# Patient Record
Sex: Male | Born: 1935 | Race: White | Hispanic: No | Marital: Married
Health system: Southern US, Community
[De-identification: ages and names within clinical notes are randomized; demographics above are authoritative.]

---

## 2006-03-19 ENCOUNTER — Other Ambulatory Visit: Payer: Self-pay

## 2006-03-19 ENCOUNTER — Inpatient Hospital Stay: Payer: Self-pay | Admitting: Internal Medicine

## 2006-04-22 ENCOUNTER — Ambulatory Visit: Payer: Self-pay | Admitting: Vascular Surgery

## 2006-06-09 ENCOUNTER — Ambulatory Visit: Payer: Self-pay | Admitting: Vascular Surgery

## 2006-07-24 ENCOUNTER — Ambulatory Visit: Payer: Self-pay | Admitting: Vascular Surgery

## 2006-07-29 ENCOUNTER — Ambulatory Visit: Payer: Self-pay | Admitting: Vascular Surgery

## 2006-07-31 ENCOUNTER — Ambulatory Visit: Payer: Self-pay | Admitting: Vascular Surgery

## 2006-08-19 ENCOUNTER — Inpatient Hospital Stay: Payer: Self-pay | Admitting: Vascular Surgery

## 2006-09-15 ENCOUNTER — Ambulatory Visit: Payer: Self-pay | Admitting: Vascular Surgery

## 2006-12-18 ENCOUNTER — Ambulatory Visit: Payer: Self-pay | Admitting: Vascular Surgery

## 2007-08-10 ENCOUNTER — Ambulatory Visit: Payer: Self-pay | Admitting: Vascular Surgery

## 2008-07-20 ENCOUNTER — Ambulatory Visit: Payer: Self-pay | Admitting: Vascular Surgery

## 2009-01-17 ENCOUNTER — Ambulatory Visit: Payer: Self-pay | Admitting: Vascular Surgery

## 2009-04-15 ENCOUNTER — Emergency Department: Payer: Self-pay | Admitting: Emergency Medicine

## 2010-12-23 ENCOUNTER — Ambulatory Visit: Payer: Self-pay | Admitting: Vascular Surgery

## 2012-01-02 ENCOUNTER — Ambulatory Visit: Payer: Self-pay | Admitting: Vascular Surgery

## 2013-01-26 ENCOUNTER — Ambulatory Visit: Payer: Self-pay | Admitting: Ophthalmology

## 2013-02-08 ENCOUNTER — Ambulatory Visit: Payer: Self-pay | Admitting: Ophthalmology

## 2013-09-14 ENCOUNTER — Ambulatory Visit: Payer: Self-pay | Admitting: Specialist

## 2014-01-16 ENCOUNTER — Inpatient Hospital Stay: Payer: Self-pay | Admitting: Surgery

## 2014-01-16 LAB — LIPASE, BLOOD: LIPASE: 182 U/L (ref 73–393)

## 2014-01-16 LAB — COMPREHENSIVE METABOLIC PANEL
ALBUMIN: 3.4 g/dL (ref 3.4–5.0)
ALK PHOS: 59 U/L
ALT: 10 U/L — AB (ref 12–78)
ANION GAP: 10 (ref 7–16)
AST: 23 U/L (ref 15–37)
BUN: 22 mg/dL — AB (ref 7–18)
Bilirubin,Total: 0.4 mg/dL (ref 0.2–1.0)
CALCIUM: 9.6 mg/dL (ref 8.5–10.1)
Chloride: 97 mmol/L — ABNORMAL LOW (ref 98–107)
Co2: 31 mmol/L (ref 21–32)
Creatinine: 1.99 mg/dL — ABNORMAL HIGH (ref 0.60–1.30)
EGFR (African American): 37 — ABNORMAL LOW
EGFR (Non-African Amer.): 32 — ABNORMAL LOW
GLUCOSE: 97 mg/dL (ref 65–99)
Osmolality: 279 (ref 275–301)
POTASSIUM: 3.1 mmol/L — AB (ref 3.5–5.1)
SODIUM: 138 mmol/L (ref 136–145)
Total Protein: 6.8 g/dL (ref 6.4–8.2)

## 2014-01-16 LAB — CBC
HCT: 27 % — ABNORMAL LOW (ref 40.0–52.0)
HGB: 8.4 g/dL — ABNORMAL LOW (ref 13.0–18.0)
MCH: 23.3 pg — ABNORMAL LOW (ref 26.0–34.0)
MCHC: 31.1 g/dL — ABNORMAL LOW (ref 32.0–36.0)
MCV: 75 fL — ABNORMAL LOW (ref 80–100)
Platelet: 235 10*3/uL (ref 150–440)
RBC: 3.61 10*6/uL — AB (ref 4.40–5.90)
RDW: 20.2 % — AB (ref 11.5–14.5)
WBC: 6 10*3/uL (ref 3.8–10.6)

## 2014-01-16 LAB — BASIC METABOLIC PANEL
ANION GAP: 5 — AB (ref 7–16)
BUN: 31 mg/dL — ABNORMAL HIGH (ref 7–18)
CHLORIDE: 107 mmol/L (ref 98–107)
CO2: 29 mmol/L (ref 21–32)
Calcium, Total: 8.2 mg/dL — ABNORMAL LOW (ref 8.5–10.1)
Creatinine: 1.71 mg/dL — ABNORMAL HIGH (ref 0.60–1.30)
EGFR (African American): 44 — ABNORMAL LOW
EGFR (Non-African Amer.): 38 — ABNORMAL LOW
GLUCOSE: 115 mg/dL — AB (ref 65–99)
OSMOLALITY: 289 (ref 275–301)
Potassium: 4.2 mmol/L (ref 3.5–5.1)
Sodium: 141 mmol/L (ref 136–145)

## 2014-01-16 LAB — TROPONIN I: TROPONIN-I: 0.04 ng/mL

## 2014-01-16 LAB — PROTIME-INR
INR: 1.1
PROTHROMBIN TIME: 14 s (ref 11.5–14.7)

## 2014-01-16 LAB — ETHANOL: Ethanol: <3 mg/dL

## 2014-01-16 LAB — APTT: ACTIVATED PTT: 23.9 s (ref 23.6–35.9)

## 2014-01-16 LAB — MAGNESIUM: Magnesium: 1.8 mg/dL

## 2014-01-17 ENCOUNTER — Ambulatory Visit: Payer: Self-pay | Admitting: Internal Medicine

## 2014-01-17 LAB — CBC WITH DIFFERENTIAL/PLATELET
Basophil #: 0 10*3/uL (ref 0.0–0.1)
Basophil %: 0.1 %
EOS ABS: 0 10*3/uL (ref 0.0–0.7)
Eosinophil %: 0 %
HCT: 20.9 % — AB (ref 40.0–52.0)
HGB: 6.5 g/dL — AB (ref 13.0–18.0)
Lymphocyte #: 0.2 10*3/uL — ABNORMAL LOW (ref 1.0–3.6)
Lymphocyte %: 3 %
MCH: 23.4 pg — ABNORMAL LOW (ref 26.0–34.0)
MCHC: 31 g/dL — AB (ref 32.0–36.0)
MCV: 76 fL — AB (ref 80–100)
MONO ABS: 0.2 x10 3/mm (ref 0.2–1.0)
Monocyte %: 3.2 %
Neutrophil #: 7.1 10*3/uL — ABNORMAL HIGH (ref 1.4–6.5)
Neutrophil %: 93.7 %
Platelet: 174 10*3/uL (ref 150–440)
RBC: 2.77 10*6/uL — ABNORMAL LOW (ref 4.40–5.90)
RDW: 20.1 % — AB (ref 11.5–14.5)
WBC: 7.5 10*3/uL (ref 3.8–10.6)

## 2014-01-17 LAB — BASIC METABOLIC PANEL
ANION GAP: 6 — AB (ref 7–16)
BUN: 30 mg/dL — AB (ref 7–18)
Calcium, Total: 7.6 mg/dL — ABNORMAL LOW (ref 8.5–10.1)
Chloride: 112 mmol/L — ABNORMAL HIGH (ref 98–107)
Co2: 25 mmol/L (ref 21–32)
Creatinine: 1.39 mg/dL — ABNORMAL HIGH (ref 0.60–1.30)
EGFR (African American): 57 — ABNORMAL LOW
EGFR (Non-African Amer.): 49 — ABNORMAL LOW
GLUCOSE: 141 mg/dL — AB (ref 65–99)
Osmolality: 294 (ref 275–301)
POTASSIUM: 4.4 mmol/L (ref 3.5–5.1)
Sodium: 143 mmol/L (ref 136–145)

## 2014-01-21 LAB — CULTURE, BLOOD (SINGLE)

## 2014-01-22 ENCOUNTER — Ambulatory Visit: Payer: Self-pay | Admitting: Internal Medicine

## 2014-01-22 DEATH — deceased

## 2014-09-12 IMAGING — CT CT ANGIO CHEST-ABD
2 of 6 series · 11 of 46 positions shown, 12 images · IV contrast (APPLIED)
Comparison: CTA of the chest and abdomen performed 01/02/2012

CLINICAL DATA: Hypotension. Chest pain radiating to the back.
Nausea and vomiting. Abdominal pain.

EXAM:
CT ANGIOGRAPHY CHEST, ABDOMEN AND PELVIS
TECHNIQUE: Multidetector CT imaging through the chest, abdomen and pelvis was
performed using the standard protocol during bolus administration of
intravenous contrast. Multiplanar reconstructed images and MIPs were
obtained and reviewed to evaluate the vascular anatomy.
CONTRAST:  100 mL of Isovue 370 IV contrast

[Series 6: cta chest · axial · 0.77mm/px · z∈[-635,-89]mm · 8 of 323 slices shown, 9 images]
[im 25/323  soft-tissue]
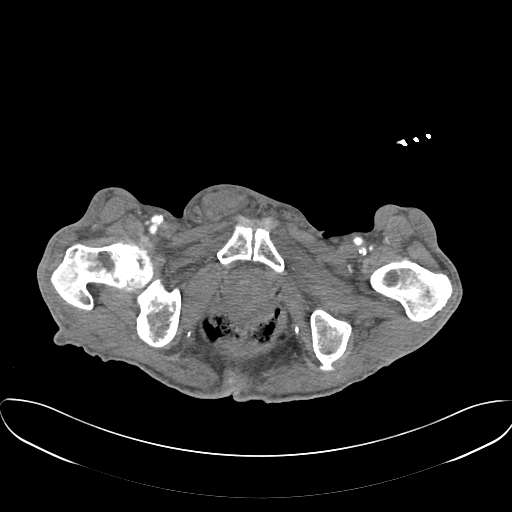
[im 25/323  bone]
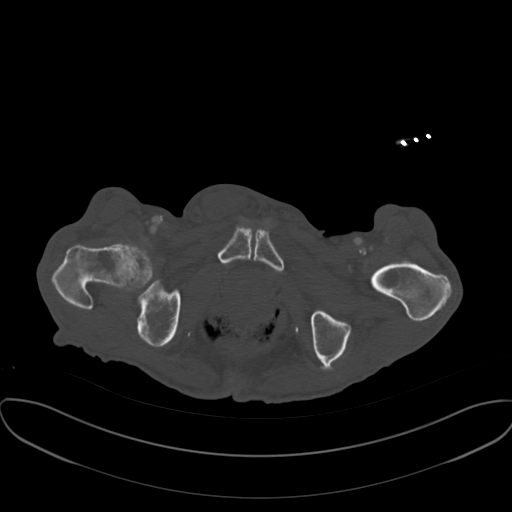
[im 62/323  soft-tissue]
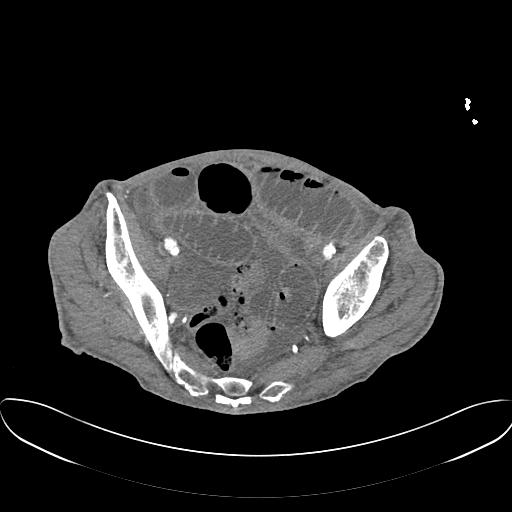
[im 100/323  soft-tissue]
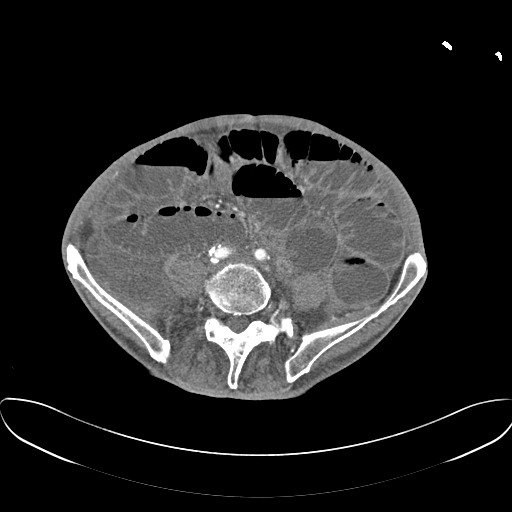
[im 137/323  soft-tissue]
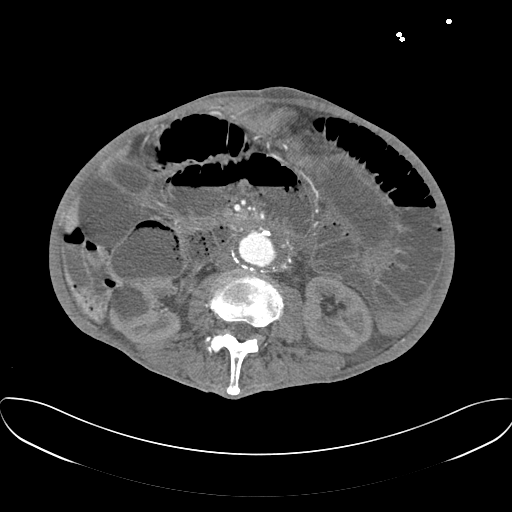
[im 186/323  soft-tissue]
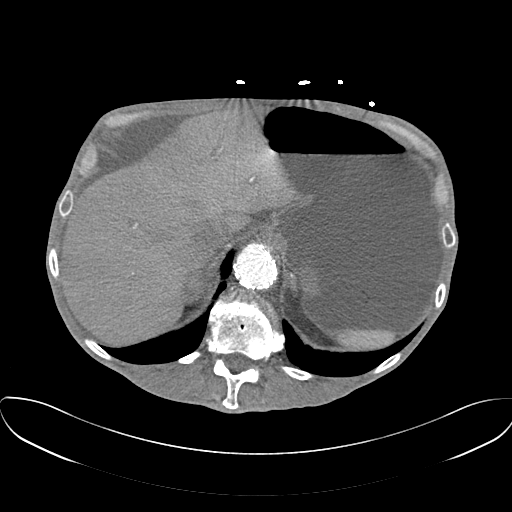
[im 223/323  soft-tissue]
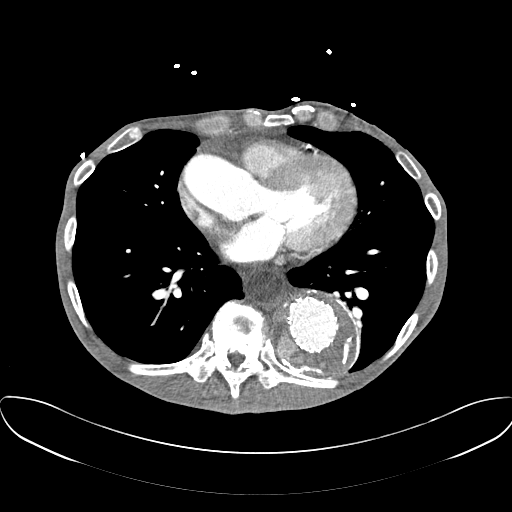
[im 261/323  soft-tissue]
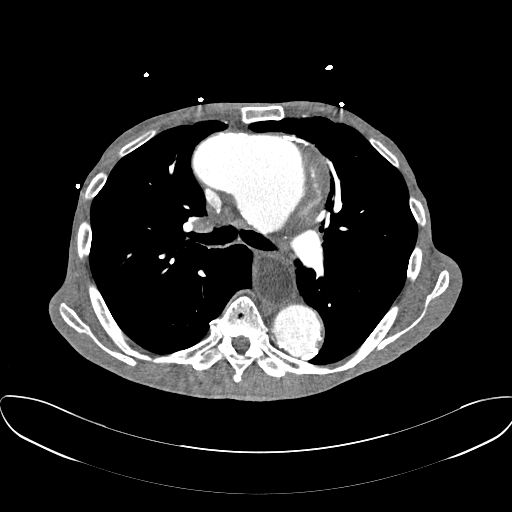
[im 298/323  soft-tissue]
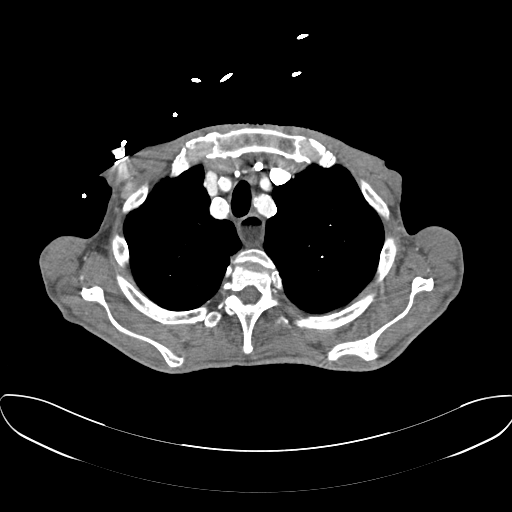

[Series 7: cor cta chest mpr · coronal · 1.23mm/px · 3 of 129 slices shown]
[im 33/129  soft-tissue]
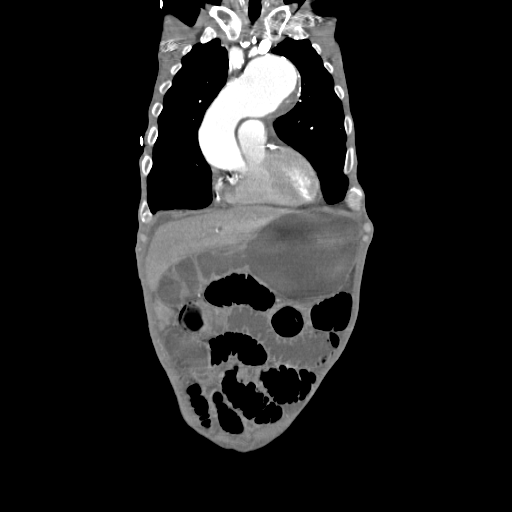
[im 65/129  soft-tissue]
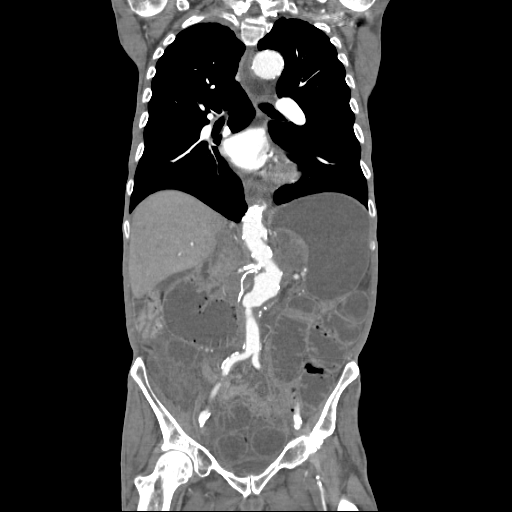
[im 97/129  soft-tissue]
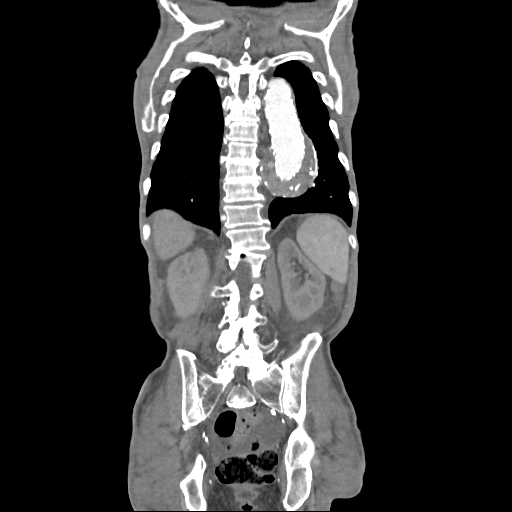

[11 of 46 positions shown; findings below may reference images not displayed]

FINDINGS: CTA CHEST FINDINGS

There is diffuse dilatation of the thoracic aorta, as on the prior
study. Dilatation of the ascending thoracic aorta is relatively
stable, measuring 4.6 cm in AP dimension. There is significantly
worsened massive dilatation of the distal aortic arch, measuring
approximately 8.5 cm in maximal diameter. This is worsened from
cm on the prior study. Mildly increased associated mural thrombus is
seen, with trace contrast extending into the thrombus. There is no
definite evidence of hemorrhage from the aneurysm; new trace
adjacent pericardial fluid remains borderline normal in attenuation,
without evidence of contrast extravasation.

Dilatation of the distal descending thoracic aorta is again seen,
now measuring 6.7 cm, increased from 6.4 cm on the prior study. This
reflects gradual chronic endoleak into the aneurysm sac surrounding
the patient's aortic stent graft; the amount of endoleak is
increased from the prior study. Aneurysmal dilatation of the
thoracic aorta resolves just above the level of the diaphragm.

Scattered calcific atherosclerotic disease is noted along the
thoracic aorta and proximal great vessels, with perhaps mild luminal
narrowing involving the left subclavian artery. Diffuse coronary
artery calcifications are seen.

There is a poorly characterized spiculated mass at the right lung
apex, measuring approximately 4.5 x 4.1 cm, with diffuse erosion of
the right first rib. Though malignancy is a possibility, this has
grown very slowly since 0604, and may reflect an unusual chronic
infectious process, such as Nocardia or Actinomyces.

Minimal airspace opacity within the left upper lobe may reflect a
mild infectious process; two nodules within the left upper lobe,
measuring 8 mm (image 30 of 65) and 6 mm (image [DATE]
reflect the infectious process or possibly metastatic disease. Mild
emphysematous change is noted at the upper lung lobes, with a few
associated blebs. Mild scarring is noted at the left lung base. No
significant pleural effusion or pneumothorax is seen.

There is diffuse dilatation of the esophagus, which is filled with
fluid. As described above, a trace pericardial effusion is seen. No
definite mediastinal lymphadenopathy seen. The thyroid gland is
grossly unremarkable in appearance. No axillary lymphadenopathy is
appreciated.

No acute osseous abnormalities are identified.

Review of the MIP images confirms the above findings.

CTA ABDOMEN AND PELVIS FINDINGS

As before, there is aneurysmal dilatation of the abdominal aorta,
extending from just below the level of the diaphragm to the
intrarenal abdominal aorta. It measures 4.2 cm in maximal AP
dimension and 4.7 cm in maximal transverse dimension, with
associated mural thrombus. There is mild to moderate luminal
narrowing at the distal aspect of the aneurysm, worsened from the
prior study. Diffuse calcification is seen along the renal arteries
bilaterally.

An apparent peripherally calcified thrombosed 3.5 cm aneurysm is
noted along the course of the proximal celiac trunk; this has
slightly increased in size from the prior study. Diffuse
calcification is seen along the superior mesenteric artery. The
inferior mesenteric artery is not well assessed.

The IVC is not well characterized but appears grossly unremarkable.
The portal venous system is incompletely assessed but also appears
unremarkable.

There is diffuse dilatation of much of the small bowel throughout
the abdomen and pelvis, measuring up to 4.6 cm in diameter. Small
bowel dilatation extends to the level of a focal transition point at
the right mid abdomen, though more distal small bowel loops are not
well assessed due to surrounding small volume ascites within the
abdomen and pelvis. Underlying mesenteric edema is suspected, but
not well seen, given the lack of intraperitoneal fat.

There is associated distention of the stomach with fluid and air,
and distention of the esophagus, as described above. Findings are
compatible with high-grade small bowel obstruction. The obstruction
may reflect an underlying adhesion.

There is an unusual area of heterogeneous fluid attenuation inferior
to the level of the cecum, measuring 8.7 x 8.7 x 4.1 cm; though this
may simply reflect partially decompressed small bowel, an impacted
mucocele or phlegmon could have such an appearance. The colon is
largely decompressed. Scattered diverticulosis is noted along the
sigmoid colon, without evidence of diverticulitis.

The liver and spleen are unremarkable in appearance. The gallbladder
is within normal limits. The pancreas and adrenal glands are
unremarkable.

Mild bilateral renal atrophy is noted. An 3.1 cm cyst is noted at
the interpole region of the right kidney, with additional smaller
cysts seen bilaterally. The kidneys are otherwise unremarkable in
appearance. There is no evidence of hydronephrosis. No renal or
ureteral stones are seen.

No free fluid is identified. The small bowel is unremarkable in
appearance. The stomach is within normal limits. No acute vascular
abnormalities are seen.

The appendix is not definitely characterized.

The bladder is mildly distended and grossly unremarkable. The
prostate remains normal in size. Note is made of the right testis
along the right inguinal canal; this may be transient in nature. No
inguinal lymphadenopathy is seen.

No acute osseous abnormalities are identified. There is chronic
grade 2 anterolisthesis of L5 on S1, reflecting underlying facet
disease. Mild anterior wedging is noted at T11 and L2, chronic in
nature.

Review of the MIP images confirms the above findings.
IMPRESSION: 1. Diffuse dilatation of much of the small bowel throughout the
abdomen and pelvis, measuring up to 4.6 cm in diameter. Small bowel
dilatation extends to the level of a focal transition point at the
right mid abdomen, though the more distal small bowel loops are not
well assessed due to surrounding small volume ascites within the
abdomen and pelvis. Underlying mesenteric edema suspected, but not
well characterized due to the lack of intraperitoneal fat. Findings
compatible with high-grade small bowel obstruction, possibly due to
an underlying adhesion.
2. Associated distention of the stomach with fluid and air, and
diffuse distention of the esophagus with fluid.
3. Unusual area of heterogeneous fluid attenuation noted inferior to
the level of the cecum, measuring 8.7 x 8.7 x 4.1 cm; though this
may simply reflect partially decompressed small bowel, an impacted
mucocele or phlegmon could have such an appearance.
4. Enlarging spiculated mass noted at the right lung apex, measuring
approximately 4.5 x 4.1 cm, with diffuse erosion of the right first
rib. Though malignancy is a possibility, this has grown very slowly
since 0604, at which time it measured 2.8 cm, and it may reflect an
unusual chronic infectious process, such as Nocardia or Actinomyces.
5. Minimal airspace opacity within the left upper lung lobe may
reflect a mild infectious process; two pulmonary nodules, measuring
8 mm and 6 mm, within the left upper lobe, may reflect the
infectious process or possibly metastatic disease.
6. Worsening massive aneurysmal dilatation of the distal aortic
arch, measuring 8.5 cm in maximal diameter, worsened from 6.8 cm on
the prior study. Mildly increased associated mural thrombus seen,
with trace contrast extending into the thrombus. Dilatation of the
distal descending thoracic aorta again noted, now measuring 6.7 cm,
increased from 6.4 cm on the prior study, with increasing associated
mural thrombus. This reflects gradually increased chronic endoleak
from the underlying aortic stent graft.
7. Aneurysmal dilatation of the abdominal aorta again noted,
measuring 4.2 cm in AP dimension and 4.7 cm in transverse dimension,
with associated mural thrombus. Mild to moderate luminal narrowing
at the distal aspect of the aneurysm is worsened from the prior
study.
8. Peripherally calcified thrombosed 3.5 cm aneurysm along the
course of the proximal celiac trunk; this has increased mildly in
size from the prior study.
9. Diffuse coronary artery calcifications seen. Diffuse
calcification along the renal arteries bilaterally, and along the
superior mesenteric artery.
10. Mild emphysematous change at the upper lung lobes, with a few
associated blebs; mild scarring at the left lung base.
11. New trace pericardial effusion; this remains borderline normal
in attenuation, without evidence of hemorrhage into the mediastinum.
12. Mild bilateral renal atrophy; small bilateral renal cysts seen.
13. Right testis incidentally noted along the right inguinal canal;
this may reflect transient retraction of the right testis.
14. Chronic grade 2 retrolisthesis of L5 on S1, reflecting
underlying facet disease; mild chronic anterior wedging of T11 and
L2.

These results were called by telephone at the time of interpretation
on 01/16/2014 at [DATE] to Dr. OGUM BULALA, who verbally acknowledged
these results.

## 2015-03-16 NOTE — Op Note (Signed)
PATIENT NAME:  Timothy Barron, [name] MR#:  [id number] DATE OF BIRTH:  12/07/1934  DATE OF PROCEDURE:  02/08/2013  PREOPERATIVE DIAGNOSIS: Visually significant cataract of the right eye.   POSTOPERATIVE DIAGNOSIS: Visually significant cataract of the right eye.   OPERATIVE PROCEDURE: Cataract extraction by phacoemulsification with implant of intraocular lens to right eye.   SURGEON: Galen ManilaWilliam Simcha Farrington, MD.   ANESTHESIA:  1. Managed anesthesia care.  2. Topical tetracaine drops followed by 2% Xylocaine jelly applied in the preoperative holding area.   COMPLICATIONS: None.   TECHNIQUE:  Stop and chop.  DESCRIPTION OF PROCEDURE: The patient was examined and consented in the preoperative holding area where the aforementioned topical anesthesia was applied to the right eye and then brought back to the Operating Room where the right eye was prepped and draped in the usual sterile ophthalmic fashion and a lid speculum was placed. A paracentesis was created with the side port blade and the anterior chamber was filled with viscoelastic. A near clear corneal incision was performed with the steel keratome. A continuous curvilinear capsulorrhexis was performed with a cystotome followed by the capsulorrhexis forceps. Hydrodissection and hydrodelineation were carried out with BSS on a blunt cannula. The lens was removed in a stop and chop technique and the remaining cortical material was removed with the irrigation-aspiration handpiece. The capsular bag was inflated with viscoelastic and the Tecnis ZCB00 21.5-diopter lens, serial number [id number] was placed in the capsular bag without complication. The remaining viscoelastic was removed from the eye with the irrigation-aspiration handpiece. The wounds were hydrated. The anterior chamber was flushed with Miostat and the eye was inflated to physiologic pressure. 0.1 mL of cefuroxime concentration 10 mg/mL was placed in the anterior chamber. The wounds were found to be  water tight. The eye was dressed with Vigamox. The patient was given protective glasses to wear throughout the day and a shield with which to sleep tonight. The patient was also given drops with which to begin a drop regimen today and will follow-up with me in one day.    ____________________________ Jerilee FieldWilliam L. Gerilynn Mccullars, MD wlp:jm D: 02/08/2013 19:33:10 ET T: 02/08/2013 21:49:04 ET JOB#: [id number]  cc: Lakishia Bourassa L. Jayle Solarz, MD, <Dictator> Jerilee FieldWILLIAM L Lesly Pontarelli MD ELECTRONICALLY SIGNED 02/17/2013 8:56

## 2015-03-16 NOTE — Op Note (Signed)
PATIENT NAME:  Timothy Barron, Timothy Barron MR#:  [id number] DATE OF BIRTH:  [birth year]  DATE OF PROCEDURE:  02/08/2013  LOCATION:  [facility]  PREOPERATIVE DIAGNOSIS: Visually significant cataract of the right eye.   POSTOPERATIVE DIAGNOSIS: Visually significant cataract of the right eye.   OPERATIVE PROCEDURE: Cataract extraction by phacoemulsification with implant of intraocular lens to right eye.   SURGEON: Galen ManilaWilliam Fernandez Kenley, MD.   ANESTHESIA:  1. Managed anesthesia care.  2. Topical tetracaine drops followed by 2% Xylocaine jelly applied in the preoperative holding area.   COMPLICATIONS: None.   TECHNIQUE: Stop and chop.   DESCRIPTION OF PROCEDURE: The patient was examined and consented in the preoperative holding area where the aforementioned topical anesthesia was applied to the right eye and then brought back to the Operating Room where the right eye was prepped and draped in the usual sterile ophthalmic fashion and a lid speculum was placed. A paracentesis was created with the side port blade and the anterior chamber was filled with viscoelastic. A near clear corneal incision was performed with the steel keratome. A continuous curvilinear capsulorrhexis was performed with a cystotome followed by the capsulorrhexis forceps. Hydrodissection and hydrodelineation were carried out with BSS on a blunt cannula. The lens was removed in a stop and chop technique and the remaining cortical material was removed with the irrigation-aspiration handpiece. The capsular bag was inflated with viscoelastic and the ZCB00 21.5-diopter lens, serial number [id number] was placed in the capsular bag without complication. The remaining viscoelastic was removed from the eye with the irrigation-aspiration handpiece. The wounds were hydrated. The anterior chamber was flushed with Miostat and the eye was inflated to physiologic pressure. 0.1 mL of cefuroxime concentration 10 mg/mL was placed in the anterior chamber. The  wounds were found to be water tight. The eye was dressed with Vigamox and Combigan. The patient was given protective glasses to wear throughout the day and a shield with which to sleep tonight. The patient was also given drops with which to begin a drop regimen today and will follow up with me in one day.    ____________________________ [name], MD wlp:jm D: 02/08/2013 19:33:10 ET T: 02/08/2013 21:49:04 ET JOB#: [id number]  cc: Saachi Zale L. Tanajah Boulter, MD, <Dictator>

## 2015-03-17 NOTE — H&P (Signed)
PATIENT NAME:  Timothy Barron, Timothy Barron MR#:  [id number] DATE OF BIRTH:  [birth year]  DATE OF ADMISSION:  01/16/2014  ADDENDUM - This is a continuation   PHYSICAL EXAMINATION:  GENERAL: He is very sallow complected with no evidence for pupillary abnormalities or jaundice.  NECK: Supple, nontender, with midline trachea. No adenopathy.  CHEST: Has very distant breath sounds, but they are present equally. He has decreased pulmonary excursion because of his abdominal distention.  CARDIAC: No murmurs or gallops. He seems to be in normal sinus rhythm.  ABDOMEN: Moderately distended, tympanitic, with some generalized abdominal discomfort but no point tenderness, no rebound, no guarding. No hernias. No masses. He does have a large midline scar.  EXTREMITIES: Lower extremity exam reveals good femoral and good popliteal pulses. No distal pulses. Full range of motion. No deformities.  PSYCHIATRIC: Reveals normal orientation and normal affect.   RADIOGRAPHS: I independently reviewed his CT scan. He does not have any evidence of free fluid in his abdomen but markedly distended loops of small bowel and decompressed distal bowel. It is very difficult to read a transition point in the CT scan because of the lack of p.o. contrast, but I believe there is decompressed small bowel in the left lower quadrant.   LABORATORY VALUES: Did demonstrate some mild anemia with a hemoglobin of 8.4. White blood cell count of 6000. His electrolytes are slightly abnormal with a creatinine of nearly 2, potassium of 3.1. I am concerned about his creatinine since he had some IV contrast load and will watch that value very carefully, perhaps involving the renal service if necessary.   We plan admission to the hospital this evening with nasogastric decompression, IV rehydration. He did have a run of ventricular tachycardia which ended spontaneously. Will admit him to the telemetry service and have internal medicine assist in the management of his  hypertension, his chronic lung disease and his ventricular tachycardia. 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 He is a very poor surgical candidate, and we do not plan any surgical intervention at the present time. Discussing the situation with his wife and with the patient, I have outlined that this illness represents a very significant insult at his age and general health and is certainly at risk of significant morbidity and mortality.    ____________________________ Quentin Orealph L. Ely III, MD rle:gb D: 01/16/2014 03:59:45 ET T: 01/16/2014 05:19:41 ET JOB#: [id number]  cc: Carmie Endalph L. Ely III, MD, <Dictator> Annice NeedyJason S. Dew, MD Jillene Bucksenny C. Arlana Pouchate, MD Quentin OreALPH L ELY MD ELECTRONICALLY SIGNED 01/25/2014 7:38

## 2015-03-17 NOTE — Discharge Summary (Signed)
PATIENT NAME:  Timothy Barron, Andre E MR#:  [id number] DATE OF BIRTH:  1935/07/19  DATE OF ADMISSION:  01/16/2014 DATE OF DISCHARGE TO HOSPICE:  2014-08-26  FINAL DIAGNOSES:  1.  Adhesive small bowel obstruction.  2.  Thoracic aneurysm.  3.  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   PRINCIPAL PROCEDURES:  1.  CT scan of the chest, abdomen and pelvis.  2.  Vascular medicine consultation featuring Dr. Wyn Quakerew.  3.  Pulmonary medicine consultation featuring Dr. Belia HemanKasa.  4.  Palliative care medicine consultation featuring Dr. Harvie JuniorPhifer.  5.  [facility] medical consultation.   HOSPITAL COURSE SUMMARY: The patient was admitted with a high-grade small bowel obstruction. He had lost a significant amount of weight. He was found to have a right apical chest lesion. This was likely a Pancoast tumor. He was also found to have an at least 9 cm arch of the ascending aorta. A nasogastric tube was placed. The patient was not really a suitable surgical candidate. Despite nasogastric tube decompression, he did not have any resolution of his small bowel obstruction. Vascular surgery did become involved, and recommendations were made that that the patient would not tolerate any type of surgical intervention for correction of the arch aneurysm. The patient did have some significant respiratory embarrassment. Palliative medicine did become involved and after speaking with the patient's family as well as myself with them in detail, arrangements were made for him to have hospice care at the hospice home. He was therefore discharged to the hospice care facility on 2014-08-26.   ____________________________ Redge GainerMark A. Egbert GaribaldiBird, MD mab:jcm D: 01/30/2014 20:18:08 ET T: 01/30/2014 22:36:09 ET JOB#: [id number]  cc: Loraine LericheMark A. Egbert GaribaldiBird, MD, <Dictator> Evalina Tabak A Wymon Swaney MD ELECTRONICALLY SIGNED 01/31/2014 0:43

## 2015-03-17 NOTE — Consult Note (Signed)
PATIENT NAME:  Timothy Barron, MEINER MR#:  [id number] DATE OF BIRTH:  [birth year]  DATE OF CONSULTATION:  01/16/2014  CONSULTING PHYSICIAN:  Ramonita Lab, MD  PRIMARY CARE PHYSICIAN: Dr. Dewaine Oats.  ATTENDING PHYSICIAN: Dr. Michela Pitcher.   REFERRING EMERGENCY ROOM PHYSICIAN: Dr. Dolores Frame.  REASON FOR MEDICAL CONSULT:  Medical management.   HISTORY OF PRESENT ILLNESS: The patient is a 80 year old male came into the ER with a chief complaint of abdominal pain, nausea and vomiting. The patient is diagnosed with small bowel obstruction, and he was admitted to Dr. Marlowe Kays service. Medical consult is placed to prime doc regarding medical management of problems except thoracic aortic aneurysm and abdominal aortic aneurysm. Vascular surgery consult is placed regarding management of the thoracic aortic aneurysm and abdominal aortic aneurysm. The patient denies any chest pain, shortness of breath during my examination, but admitting cough for the past few days. Wife is at bedside. The patient's wife is reporting that the patient still continues to smoke and has lost approximately 40 pounds in the past 8 months, which is unintentional. CT of the chest and abdomen was done in the ER, which has revealed pulmonary nodules. The patient is placed on NG tube.  The patient denies any chest pain during my examination. Denies any fevers.   PAST MEDICAL HISTORY: COPD, hypertension, pulmonary nodules, failure to thrive with weight loss of 40 pounds in 8 months, abdominal aortic aneurysm, thoracic aortic aneurysm.   PAST SURGICAL HISTORY: Thoracic aortic aneurysm repair.  ALLERGIES: No known drug allergies.   PSYCHOSOCIAL HISTORY: Lives at home with wife. The patient used to smoke 1-1/2 packs to 2 packs until 1 year ago. The patient was not smoking in the past 2 weeks as reported by the wife.   FAMILY HISTORY: Hypertension runs in his family.  HOME MEDICATIONS: Terazosin 10 mg 1 capsule p.o. once daily, simvastatin 40 mg p.o. at bedtime,  lisinopril 20 mg once a day, furosemide 20 mg once daily, Coreg 6.25 mg, aspirin 81 mg once daily, alprazolam 0.5 mg as needed, Advair 250/50  two puff inhalation twice a day.   REVIEW OF SYSTEMS: CONSTITUTIONAL: Denies any fever. Complaining of fatigue.  EYES: Denies blurry vision, double vision.  ENT: Denies epistaxis, discharge.  RESPIRATION: Denies cough. Has chronic COPD.  CARDIOVASCULAR: Denies chest pain or palpitations.  GASTROINTESTINAL: Has nausea, vomiting. Denies diarrhea. No abdominal pain at this point.  ENDOCRINOLOGY: Denies polyuria, nocturia, thyroid problems.  HEMATOLOGIC AND LYMPHATIC: No anemia, easy bruising, bleeding. INTEGUMENTARY: No acne, rash, lesions.   MUSCULOSKELETAL: No joint effusion, tenderness, erythema.  PSYCHIATRIC: Normal mood and affect.   PHYSICAL EXAMINATION:  GENERAL APPEARANCE: Not in acute distress. Moderately built and thin-looking Caucasian male. HEENT: Normocephalic, atraumatic. Pupils are equally reacting to light and accommodation. No scleral icterus. No conjunctival injection. No sinus tenderness. No postnasal drip. Dry mucous membranes.  NECK: Supple. No JVD. No thyromegaly.  LUNGS: With decreased breath sounds and crackles.  CARDIOVASCULAR: Regular rate and rhythm.  GASTROINTESTINAL: Rigid, no bowel sounds ,no masses felt. Positive rebound tenderness.  NEUROLOGIC: Awake, alert, oriented x 3. Motor and sensory grossly intact. Reflexes are 2+.  EXTREMITIES: No edema. No cyanosis. No clubbing.  SKIN: Warm to touch. Normal turgor. No rashes. No lesions.  MUSCULOSKELETAL: No joint effusion, tenderness, erythema.  PSYCHIATRIC: Normal mood and affect.  LABORATORIES: LFTs are normal except the ALT of 10, troponin 0.04. WBC 6.6, hemoglobin 8.4, hematocrit 37.0, platelets 257. PT 14.0, INR 1.1, antibody  negative.   ASSESSMENT AND PLAN:  A 80 year old male admitted to Dr. Marlowe KaysEly's service.  Medical consult was placed for referring medical management.   1. Sepsis with pneumonia and hypotension. Blood cultures and sputum cultures were ordered. The patient will be given IV Zosyn and levofloxacin.  2. Hypokalemia. We will replace IV fluids with potassium supplements and check potassium and magnesium.  3. Chronic obstructive pulmonary disease. Not in exacerbation. We will provide nebulizer treatments as-needed basis.  4. Pulmonary nodules with failure to thrive. Needs more investigation to be done to rule out cancer after taking care of acute small bowel obstruction.  5. Hypertension. Currently, the patient is hypotensive and getting IV fluids. We will hold off with her blood pressure medications for now.  6. History of abdominal aortic aneurysm and thoracic aortic aneurysm. Medical consult was placed by Dr. Michela PitcherEly to vascular surgeon, awaiting vascular surgeons input.  7. We will provide him gastrointestinal prophylaxis with Protonix and deep vein thrombosis prophylaxis with sequential compression devices.  Plan of care discussed with the patient and his wife at bedside. They verbalized understanding of the plan.   TOTAL TIME SPENT ON THE CONSULT: 45 min . ____________________________ Ramonita LabAruna Izen Petz, MD ag:sg D: 01/16/2014 05:42:00 ET T: 01/16/2014 08:27:49 ET JOB#: [id number]  cc: Jillene Bucksenny C. Arlana Pouchate, MD Ramonita LabAruna Quientin Jent, MD, <Dictator>    Ramonita LabARUNA Mersadez Linden MD ELECTRONICALLY SIGNED 01/29/2014 1:02

## 2015-03-17 NOTE — H&P (Signed)
PATIENT NAME:  Timothy Barron, Timothy Barron MR#:  [id number] DATE OF BIRTH:  03-Sep-1935  DATE OF ADMISSION:  01/16/2014  PRIMARY CARE PHYSICIAN: Dr. Dewaine Oatsenny Tate.   ADMITTING PHYSICIAN: Dr. Michela PitcherEly.   CHIEF COMPLAINT: Abdominal and back pain, nausea and vomiting.   BRIEF HISTORY: Timothy Barron is a 80 year old gentleman seen in the Emergency Room with a several day history of increasing abdominal pain. He has chronic back pain which has worsened over the last several days, and he has had increasing abdominal distention and abdominal pain. The pain is primarily crampy in nature, centered around his umbilicus. Last evening, he began to vomit multiple times. He presented to the Emergency Room for further evaluation. Initial presentation in the Emergency Room revealed him to be syncopal with a blood pressure of 70/50, heart rate of 88. He has a history of thoracic aortic aneurysm and abdominal aortic aneurysm, both treated with stent grafts, but a large endoleak in the thoracic aneurysm and a remaining arch aneurysm, so he was taken urgently to CT scan for a CTA. No leak was identified, and there was not any evidence of aneurysmal hemorrhage. The transverse pictures of the patient's abdomen did demonstrate multiple loops of dilated small bowel suggestive of a partial small bowel obstruction, possible high-grade mechanical obstruction. The surgical service was consulted.   He denies any previous similar symptoms. He did have an incarcerated left inguinal hernia treated with local anesthesia surgery in 2007. An onlay patch was utilized in Holiday representativethe repair. No bowel was resected. He had some midline surgical procedure in the distant past performed by Dr. Theodis SatoAmjad Barron. The patient was told he had 4 hernias. He does have a large laparotomy incision. He denies any other stomach problems. Denies a history of hepatitis, yellow jaundice, pancreatitis, peptic ulcer disease, gallbladder disease or diverticulitis. He has not had a colonoscopy or  upper endoscopy. Denies any urinary tract symptoms. Does have a history of significant chronic obstructive lung disease and hypertension. He denies any history of coronary artery disease.   MEDICATIONS ON ADMISSION: Include Advair 250/50 twice a day, alprazolam 0.5 mg p.r.n., aspirin 81 mg daily, Coreg 6.25 mg b.i.d., furosemide 20 mg once a day, lisinopril 20 mg once a day, simvastatin 40 mg once a day, terazosin 10 mg once a day at bedtime. He is not on any other anticoagulation.   FAMILY HISTORY: Noncontributory.   REVIEW OF SYSTEMS: Otherwise unremarkable.   He is a long-standing cigarette smoker, smoking 1/2 pack to a pack of cigarettes on a daily basis.  He has no medical allergies. He does drink alcohol regularly but has not had any alcohol in several months.   His blood pressure responded to IV hydration to 120/70. His pain scale has remained a 10. His HEENT exam is unremarkable. He is very sallow complected, but he has no   DICTATION ENDS HERE   ____________________________ Carmie Endalph L. Ely III, Barron rle:gb D: 01/16/2014 03:51:57 ET T: 01/16/2014 05:02:57 ET JOB#: [id number]  cc: Timothy Orealph L. Ely III, Barron, <Dictator> Timothy Barron ELECTRONICALLY SIGNED 01/25/2014 7:38

## 2015-03-17 NOTE — Consult Note (Signed)
CHIEF COMPLAINT and HISTORY:  Subjective/Chief Complaint abdominal pain/distention   History of Present Illness Patient is a 80 yo WM with a long history of aneurysmal disease and lung disease who is admitted for SBO.  Has an NG tube in now which has improved the distention and swelling in his abdomen some, but this persists.  He has had multiple previous abdominal surgical procedures.  He is s/p repair of a descending thoracic aortic aneurysm about 7 years ago and has had a known endoleak for some time.  This part of his aneurysmal disease appears reasonably stable on his CT scan.  He has stopped coming for his follow up visits in the office even though that was recommended and has not been seen in a few years.  He has an intracerebral aneurysm and was referred for neurosurgical evaluation several years ago that he did not follow through with. I have reviewed his CT scan and he has mutliple findings.  His stent graft area remains about the same with a chronic indeterminate endoleak and a diameter of about 6.7 cm.  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  This has been present previously but some growth has occurred.  His infrarenal aorta remains below 5 cm in maximal diamter.  The most worrisome finding on his CT scan is a nearly 9 cm aortic arch aneurysm.  This was present several years ago and he was not that interested in considering repair, but it has grown several cm.  This begins just after the innominate artery and extends beyond the left subclavian.  His aortic root is over 40 mm prior to the large aneurysm.   PAST MEDICAL/SURGICAL HISTORY:  Past Medical History:   Hypertension:    Hypercholesterolemia:    AAA - Abdominal Aortic Anuerysm:    CHF:    Stent, Cardiac:   ALLERGIES:  Allergies:  No Known Allergies:   HOME MEDICATIONS:  Home Medications: Medication Instructions Status  Vitamins  Active  Alprazolam 0.5 mg prn.  Active  Advair 250/50 twice day.   Active  Simvastatin 40 mg one day.  Active  Lisinopril 20 mg one day.  Active  Coreg 6.25 mg twice day.  Active  Furosemide 20 mg one day.  Active  Asa 81 mg one day.  Active  terazosin 10 mg oral capsule 1 cap(s) orally once a day (at bedtime) Active   Family and Social History:  Family History Non-Contributory   Social History positive  tobacco (Current within 1 year), positive ETOH   Place of Living Home   Review of Systems:  Subjective/Chief Complaint abdominal pain/distention   Fever/Chills No   Cough Yes   Sputum Yes   Abdominal Pain Yes   Diarrhea No   Constipation Yes   Nausea/Vomiting Yes   SOB/DOE Yes   Chest Pain No   Telemetry Reviewed NSR   Dysuria No   Tolerating PT Yes   Tolerating Diet No  Nauseated  Vomiting   Medications/Allergies Reviewed Medications/Allergies reviewed   Physical Exam:  GEN thin, chronically ill appearing   HEENT pale conjunctivae, dry oral mucosa   NECK No masses  trachea midline   RESP postive use of accessory muscles  wheezing  BS distant   CARD regular rate  no JVD   VASCULAR ACCESS none   ABD positive tenderness  rigid  distended  increased aortic impulse   GU foley catheter in place  clear yellow urine draining   LYMPH negative  neck, negative axillae   EXTR negative cyanosis/clubbing, negative edema   SKIN No ulcers, skin turgor poor   NEURO cranial nerves intact, motor/sensory function intact   PSYCH alert, A+O to time, place, person   LABS:  Laboratory Results: Hepatic:    23-Feb-15 01:49, Comprehensive Metabolic Panel  Bilirubin, Total 0.4  Alkaline Phosphatase 59  45-117  NOTE: New Reference Range  10/14/13  SGPT (ALT) 10  SGOT (AST) 23  Total Protein, Serum 6.8  Albumin, Serum 3.4  Routine BB:    23-Feb-15 01:49, Type and Antibody Screen  ABO Group + Rh Type   O Negative  Antibody Screen NEGATIVE  Result(s) reported on 16 Jan 2014 at 02:36AM.  Cardiology:    23-Feb-15 01:46,  ECG  Ventricular Rate 94  Atrial Rate 94  QRS Duration 138  QT 418  QTc 522  R Axis -78  T Axis 99  ECG interpretation   *** Poor data quality, interpretation may be adversely affected  Wide QRS rhythm  Left axis deviation  Left bundle branch block  Abnormal ECG  When compared with ECG of 26-Jan-2013 15:17,  Wide QRS rhythm has replaced Sinus rhythm  Vent. rate has increased BY  42 BPM  ----------unconfirmed----------  Confirmed by OVERREAD, NOT ([id number]), editor PEARSON, BARBARA ([id number]) on 01/16/2014 9:00:43 AM  ECG     23-Feb-15 03:46, ECG  Ventricular Rate 87  Atrial Rate 87  P-R Interval 178  QRS Duration 156  QT 430  QTc 517  P Axis 68  R Axis -80  T Axis 104  ECG interpretation   Normal sinus rhythm  Left axis deviation  Left bundle branch block  Abnormal ECG  When compared with ECG of 26-Jan-2013 15:17,  Vent. rate has increased BY  35 BPM  T wave inversion more evident in Lateral leads  QT has lengthened  ----------unconfirmed----------  Confirmed by OVERREAD, NOT ([id number]), editor PEARSON, BARBARA ([id number]) on 01/16/2014 9:00:52 AM  ECG   Routine Chem:    23-Feb-15 01:49, Comprehensive Metabolic Panel  Glucose, Serum 97  BUN 22  Creatinine (comp) 1.99  Sodium, Serum 138  Potassium, Serum 3.1  Chloride, Serum 97  CO2, Serum 31  Calcium (Total), Serum 9.6  Osmolality (calc) 279  eGFR (African American) 37  eGFR (Non-African American) 32  eGFR values <51m/min/1.73 m2 may be an indication of chronic  kidney disease (CKD).  Calculated eGFR is useful in patients with stable renal function.  The eGFR calculation will not be reliable in acutely ill patients  when serum creatinine is changing rapidly. It is not useful in   patients on dialysis. The eGFR calculation may not be applicable  to patients at the low and high extremes of body sizes, pregnant  women, and vegetarians.  Anion Gap 10    23-Feb-15 01:49, Ethanol, Serum  Ethanol, S. < 3  Ethanol % (comp) < 0.003   Result(s) reported on 16 Jan 2014 at 02:23AM.    23-Feb-15 01:49, Lipase  Lipase 182  Result(s) reported on 16 Jan 2014 at 02:11AM.    23-Feb-15 01:49, Magnesium, Serum  Magnesium, Serum 1.8  1.8-2.4  THERAPEUTIC RANGE: 4-7 mg/dL  TOXIC: > 10 mg/dL   -----------------------    23-Feb-15 129:93 Basic Metabolic Panel (w/Total Calcium)  Glucose, Serum 115  BUN 31  Creatinine (comp) 1.71  Sodium, Serum 141  Potassium, Serum 4.2  Chloride, Serum 107  CO2, Serum 29  Calcium (Total), Serum 8.2  Anion Gap 5  Osmolality (calc) 289  eGFR (African American) 44  eGFR (Non-African American) 38  eGFR values <85m/min/1.73 m2 may be an indication of chronic  kidney disease (CKD).  Calculated eGFR is useful in patients with stable renal function.  The eGFR calculation will not be reliable in acutely ill patients  when serum creatinine is changing rapidly. It is not useful in   patients on dialysis. The eGFR calculation may not be applicable  to patients at the low and high extremes of body sizes, pregnant  women, and vegetarians.  Cardiac:    23-Feb-15 01:49, Troponin I  Troponin I 0.04  0.00-0.05  0.05 ng/mL or less: NEGATIVE   Repeat testing in 3-6 hrs   if clinically indicated.  >0.05 ng/mL: POTENTIAL   MYOCARDIAL INJURY. Repeat   testing in 3-6 hrs if   clinically indicated.  NOTE: An increase or decrease   of 30% or more on serial   testing suggests a   clinically important change  Routine Coag:    23-Feb-15 01:49, Activated PTT  Activated PTT (APTT) 23.9  A HCT value >55% may artifactually increase the APTT. In one study,  the increase was an average of 19%.  Reference: "Effect on Routine and Special Coagulation Testing Values  of Citrate Anticoagulant Adjustment in Patients with High HCT Values."  American Journal of Clinical Pathology 2006;126:400-405.    23-Feb-15 01:49, Prothrombin Time  Prothrombin 14.0  INR 1.1  INR reference interval applies to patients on  anticoagulant therapy.  A single INR therapeutic range for coumarins is not optimal for all  indications; however, the suggested range for most indications is  2.0 - 3.0.  Exceptions to the INR Reference Range may include: Prosthetic heart  valves, acute myocardial infarction, prevention of myocardial  infarction, and combinations of aspirin and anticoagulant. The need  for a higher or lower target INR must be assessed individually.  Reference: The Pharmacology and Management of the Vitamin K   antagonists: the seventh ACCP Conference on Antithrombotic and  Thrombolytic Therapy. CJMEQA.8341Sept:126 (3suppl): [id number]  A HCT value >55% may artifactually increase the PT.  In one study,   the increase was an average of 25%.  Reference:  "Effect on Routine and Special Coagulation Testing Values  of Citrate Anticoagulant Adjustment in Patients with High HCT Values."  American Journal of Clinical Pathology 2006;126:400-405.  Routine Hem:    23-Feb-15 01:49, Hemogram, Platelet Count  WBC (CBC) 6.0  RBC (CBC) 3.61  Hemoglobin (CBC) 8.4  Hematocrit (CBC) 27.0  Platelet Count (CBC) 235  Result(s) reported on 16 Jan 2014 at 02:10AM.  MCV 75  MCH 23.3  MCHC 31.1  RDW 20.2   RADIOLOGY:  Radiology Results: XRay:    23-Feb-15 07:40, KUB - Kidney Ureter Bladder  KUB - Kidney Ureter Bladder  REASON FOR EXAM:    SBO, NG placement  COMMENTS:       PROCEDURE: DXR - DXR KIDNEY URETER BLADDER  - Jan 16 2014  7:40AM     CLINICAL DATA:  80year old male with small bowel obstruction. NG  tube placement. Initial encounter.    EXAM:  ABDOMEN - 1 VIEW    COMPARISON:  CTA chest and abdomen 01/16/2014 and earlier.    FINDINGS:  Portable AP supine view at 0733 hrs.  Enteric tube placed, side hole the level of the gastric body.  Decreased gastric distention.    Excreted IV contrast in bladder, nondilated left renal collecting  system, and dilated right renal collecting system.    Gas-filled  dilated small bowel loops up to 59 mm diameter, not  improved. Paucity of colonic gas as before. No definite  pneumoperitoneum. Thoracoabdominal and a graphpartially re-  identified. Stable visualized osseous structures. Aortoiliac  calcified atherosclerosis noted.     IMPRESSION:  1. Enteric tube placed, side hole the level of the gastric body.  Decreased gastric distention.  2. Dilated small bowel loops without improvement.  3. IV contrast being excreted into a dilated right renal collecting  system compatible with right hydronephrosis, new from earlier CTA.      Electronically Signed    By: Lars Pinks M.D.    On: 01/16/2014 08:00         Verified By: Gwenyth Bender. HALL, M.D.,  [place]:    22-Oct-14 10:00, MRI Cervical Spine Without Contrast  PACS Image    23-Feb-15 02:23, CT Angiography Chest/Abd W/WO Combo  PACS Image    23-Feb-15 07:40, KUB - Kidney Ureter Bladder  PACS Image  MRI:    22-Oct-14 10:00, MRI Cervical Spine Without Contrast  MRI Cervical Spine Without Contrast  REASON FOR EXAM:    Neck Pain  COMMENTS:       PROCEDURE: MMR - MMR CERVICAL SPINE WO CONT  - Sep 14 2013 10:00AM     RESULT: None gadolinium sagittal and axial MR imaging of the cervical   spine is performed in standard fashion. The patient has no previous exam   for comparison.    There is reversal of the normal cervical lordosis most prominently seen   at the C5-C6 level were there is disc protrusion and bony spurring.   Similar changes are seen at C6-C7 to a lesser degree. There is mild   anterolisthesis of C4 on C5. There is mild increased T2 signal at the C5   and C6 levels. There is severe spinal stenosis at C5-C6 without   significant cord deformity. There is also C6-C7 spinal canal narrowing     with increased signal within the central portion of the cord no definite   well-defined syrinx is appreciated. The foramina at C3-C4 appear widely   patent. C4-C5 shows no significant foraminal  narrowing. There is   right-sided foraminal narrowing at C5-C6 that is moderately severe. There  is bilateral C6-C7 foraminal narrowing that is mild to moderate on the   left and moderate on the right. C7-T1 does not show severe foraminal   stenosis. There is no evidence of marrow edema compression fracture.   Facet arthropathy is present.    IMPRESSION:   1. Multilevel degenerative changes. Reversal of the normal cervical   lordosis. C4 on C5 anterolisthesis. Spinal canal narrowing at C5-C6 and   C6-C7 with evidence of cord signal abnormality which could represent mild   edema. There areas of moderate foraminal narrowing as described. Surgical   consultation is recommended.  Dictation Site: 1        Verified By: Sundra Aland, M.D., MD  CT:    23-Feb-15 02:23, CT Angiography Chest/Abd W/WO Combo  CT Angiography Chest/Abd W/WO Combo  REASON FOR EXAM:    BACK PAIN, H/O THORACIC & AAA, HYPOTENSIVE  COMMENTS:       PROCEDURE: CT  - CT ANGIOGRAPHY CHEST/ABD W/WO  - Jan 16 2014  2:23AM     CLINICAL DATA:  Hypotension. Chest pain radiating to the back.  Nausea and vomiting. Abdominal pain.    EXAM:  CT ANGIOGRAPHY CHEST, ABDOMEN AND PELVIS    TECHNIQUE:  Multidetector CT imaging through  the chest, abdomen and pelvis was  performed using the standard protocol during bolus administration of  intravenous contrast. Multiplanar reconstructed images and MIPs were  obtained and reviewed to evaluate the vascular anatomy.    CONTRAST:  100 mL of Isovue 370 IV contrast    COMPARISON:  CTA of the chest and abdomen performed 01/02/2012    FINDINGS:  CTA CHEST FINDINGS    There is diffuse dilatation of the thoracic aorta, as on the prior  study. Dilatation of the ascending thoracic aorta is relatively  stable, measuring 4.6 cm in AP dimension. There is significantly  worsened massive dilatation of the distal aortic arch, measuring  approximately 8.5 cm in maximal diameter. This is  worsened from 6.8  cm on the prior study. Mildly increased associated mural thrombus is  seen, with trace contrast extending into the thrombus. There is no  definite evidence of hemorrhage from the aneurysm; new trace  adjacent pericardial fluid remains borderline normal in attenuation,  without evidence of contrast extravasation.    Dilatation of the distal descending thoracic aorta is again seen,  now measuring 6.7 cm, increased from 6.4 cm on the prior study. This  reflects gradual chronic endoleak into the aneurysm sac surrounding  the patient's aortic stent graft; the amount of endoleak is  increased from the prior study. Aneurysmal dilatation of the  thoracic aorta resolves just above the level of the diaphragm.    Scattered calcific atherosclerotic disease is noted along the  thoracic aorta and proximal great vessels, with perhaps mild luminal  narrowing involving the left subclavian artery. Diffuse coronary  artery calcifications are seen.    There is a poorly characterized spiculated mass at the right lung  apex, measuring approximately 4.5 x 4.1 cm, with diffuse erosion of  the right first rib. Though malignancy is a possibility, this has  grown very slowly since 2013, and may reflect an unusual chronic  infectious process, such as Nocardia or Actinomyces.    Minimal airspace opacity within the left upper lobe may reflect a  mild infectious process; two nodules within the left upper lobe,  measuring 8 mm (image 30 of 65) and 53m (image 33 of 65), may  reflect the infectious process or possibly metastatic disease. Mild  emphysematous change is noted at the upper lung lobes, with a few  associated blebs. Mild scarring is noted at the left lung base. No  significant pleural effusion or pneumothorax is seen.  There is diffuse dilatation of the esophagus, which is filled with  fluid. As described above, a trace pericardial effusion is seen. No  definite mediastinal  lymphadenopathy seen. The thyroid gland is  grossly unremarkable in appearance. No axillary lymphadenopathy is  appreciated.    No acute osseous abnormalities are identified.    Review of the MIP images confirms the above findings.    CTA ABDOMEN AND PELVIS FINDINGS    As before, there is aneurysmal dilatation of the abdominal aorta,  extending from just below the level of the diaphragm to the  intrarenal abdominal aorta. It measures 4.2 cm in maximal AP  dimension and 4.7 cm in maximal transverse dimension, with  associated mural thrombus. There is mildto moderate luminal  narrowing at the distal aspect of the aneurysm, worsened from the  prior study. Diffuse calcification is seen along the renal arteries  bilaterally.    An apparent peripherally calcified thrombosed 3.5 cm aneurysm is  noted alongthe course of the proximal celiac trunk; this has  slightly increased in size from the prior study. Diffuse  calcification is seen along the superior mesenteric artery. The  inferior mesenteric artery is not well assessed.    The IVC is not well characterized but appears grossly unremarkable.  The portal venous system is incompletely assessed but also appears  unremarkable.  There is diffuse dilatation of much of the small bowel throughout  the abdomen and pelvis, measuring up to 4.6 cm in diameter. Small  bowel dilatation extends to the level of a focal transition point at  the right mid abdomen, though more distal small bowel loops are not  well assessed due to surrounding small volume ascites within the  abdomen and pelvis. Underlying mesenteric edema is suspected, but  not well seen, given the lack of intraperitoneal fat.    There is associated distention of the stomach with fluid and air,  and distention of the esophagus, as described above. Findings are  compatible with high-grade small bowel obstruction. The obstruction  may reflect an underlying adhesion.    There is an  unusual area of heterogeneous fluid attenuation inferior  to the level of the cecum, measuring 8.7 x 8.7 x 4.1 cm; though this  may simply reflect partially decompressed small bowel, an impacted  mucocele or phlegmon could have such an appearance. The colon is  largely decompressed. Scattered diverticulosis is noted along the  sigmoid colon, without evidence of diverticulitis.    The liver and spleen are unremarkable in appearance. The gallbladder  is within normal limits. The pancreas and adrenal glands are  unremarkable.    Mild bilateral renal atrophy is noted. An 3.1 cm cyst is noted at  the interpole region of the right kidney, with additional smaller  cysts seen bilaterally. The kidneys are otherwise unremarkable in  appearance. There is no evidence of hydronephrosis. No renal or  ureteral stones are seen.  No free fluid is identified. The small bowel is unremarkable in  appearance. The stomach is within normal limits. No acute vascular  abnormalities are seen.    The appendix is not definitely characterized.    The bladder is mildly distended and grossly unremarkable. The  prostate remains normal in size. Note is made of the right testis  along the right inguinal canal; this may be transient in nature. No  inguinal lymphadenopathy is seen.    No acute osseous abnormalities are identified. There is chronic  grade 2 anterolisthesis of L5 on S1, reflecting underlying facet  disease. Mildanterior wedging is noted at T11 and L2, chronic in  nature.  Review of the MIP images confirms the above findings.     IMPRESSION:  1. Diffuse dilatation of much of the small bowel throughout the  abdomen and pelvis, measuring up to 4.6 cm in diameter. Small bowel  dilatation extends to the level of a focal transition point at the  right mid abdomen, though the more distal small bowel loops are not  well assessed due to surrounding small volume ascites within the  abdomen and pelvis.  Underlying mesenteric edema suspected, but not  well characterized due to the lack of intraperitoneal fat. Findings  compatible with high-grade small bowel obstruction, possibly due to  an underlying adhesion.  2. Associated distention of the stomach with fluid and air, and  diffuse distention of the esophagus with fluid.  3. Unusual area of heterogeneous fluid attenuation noted inferior to  the level of the cecum, measuring 8.7 x 8.7 x 4.1 cm; though this  may simply  reflect partially decompressed small bowel, an impacted  mucocele or phlegmon could have such an appearance.  4. Enlarging spiculated mass noted at the right lung apex, measuring  approximately 4.5 x 4.1 cm, with diffuse erosion of the right first  rib. Though malignancy is a possibility, this has grown very slowly  since 2013, at which time it measured 2.8 cm, and it may reflect an  unusual chronic infectious process, such as Nocardia or Actinomyces.  5. Minimal airspace opacity within the left upper lung lobe may  reflect a mild infectious process; two pulmonary nodules, measuring  8 mm and 6 mm, within the left upper lobe, may reflect the  infectious process or possibly metastatic disease.  6. Worsening massive aneurysmal dilatation of the distal aortic  arch, measuring 8.5 cm inmaximal diameter, worsened from 6.8 cm on  the prior study. Mildly increased associated mural thrombus seen,  with trace contrast extending into the thrombus. Dilatation of the  distal descending thoracic aorta again noted, now measuring 6.7 cm,  increased from 6.4 cm on the prior study, with increasing associated  mural thrombus. This reflects gradually increased chronic endoleak  from the underlying aortic stent graft.  7. Aneurysmal dilatation of the abdominal aorta again noted,  measuring 4.2cm in AP dimension and 4.7 cm in transverse dimension,  with associated mural thrombus. Mild to moderate luminal narrowing  at the distal aspect of the  aneurysm is worsened from the prior  study.  8. Peripherally calcified thrombosed 3.5 cm aneurysm along the  course of the proximal celiac trunk; this has increased mildly in  size from the prior study.  9. Diffuse coronary artery calcifications seen. Diffuse  calcification along the renal arteries bilaterally, and along the  superior mesenteric artery.  10. Mild emphysematous change at the upper lung lobes, with a few  associated blebs; mild scarring at the left lung base.  11. New trace pericardial effusion; this remains borderline normal  in attenuation, without evidence of hemorrhage into the mediastinum.  12. Mild bilateral renal atrophy; small bilateral renal cysts seen.  13. Right testis incidentally noted along the right inguinal canal;  this may reflect transient retraction of the right testis.  14. Chronic grade 2 retrolisthesis of L5 on S1, reflecting  underlying facet disease; mild chronic anterior wedging of T11 and  L2.    These results were called by telephone at the time of interpretation  on 01/16/2014 at 2:38 AM to Dr. Lurline Hare, who verbally acknowledged  these results.  Electronically Signed    By: Garald Balding M.D.    On: 01/16/2014 03:54         Verified By: JEFFREY . CHANG, M.D.,   ASSESSMENT AND PLAN:  Assessment/Admission Diagnosis SBO Chronic pulmonary disease with oxygen dependence. Heart disease Severe aneurysm disease in multiple locations as below.   Plan I have reviewed his CT scan and he has mutliple findings.  His stent graft area remains about the same with a chronic indeterminate endoleak and a diameter of about 6.7 cm.  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  This has been present previously but some growth has occurred.  His infrarenal aorta remains below 5 cm in maximal diamter.  The most worrisome finding on his CT scan is a nearly 9 cm aortic arch aneurysm.  This was present several years ago and he was not that  interested in considering repair, but it has grown several cm.  This begins  just after the innominate artery and extends beyond the left subclavian.  His aortic root is over 40 mm prior to the large aneurysm.   The aortic components are not a major issue at this point and do not require immediate repair. The descending thoracic aorta is basically stable and not a major issue at this point.   The aortic arch aneurysm is quite large a problematic and has a high rupture risk at this size.  The location is very difficult to repair and the only option for a stent graft repair would require a complete great vessel debranching of the innominate, left carotid and possibly the left subclavian (or leave it occluded proximally).  This may require a whole aortic root replacement with cardiopulmonary bypass and possibly circulatory arrest.  This would need to be evaluated by a cardiothoracic surgeon to see what the best option would be, but I am not sure he will be an acceptible candidate for anything given his multiple other issues.  This should be done at a tertiary center if he considers repair.  This is not an immediate, urgent repair need but the size is very large and referral would be recommended when he is felt stable from his other issues.  I will be happy to help with referral if desired.     level 5   Electronic Signatures: Algernon Huxley (MD)  (Signed 23-Feb-15 14:07)  Authored: Chief Complaint and History, PAST MEDICAL/SURGICAL HISTORY, ALLERGIES, HOME MEDICATIONS, Family and Social History, Review of Systems, Physical Exam, LABS, RADIOLOGY, Assessment and Plan   Last Updated: 23-Feb-15 14:07 by Algernon Huxley (MD)

## 2015-03-17 NOTE — Consult Note (Signed)
   Comments   Family have discussed options and pt wants comfort care and [facility]. [facility] liason notified and will see pt in AM. Dr Egbert GaribaldiBird aware.  Electronic Signatures: Keigan Girten, Harriett SineNancy (MD)  (Signed 24-Feb-15 15:54)  Authored: Palliative Care   Last Updated: 24-Feb-15 15:54 by Jayme Mednick, Harriett SineNancy (MD)
